# Patient Record
Sex: Female | Born: 1950 | Race: White | Hispanic: No | Smoking: Former smoker
Health system: Southern US, Community
[De-identification: ages and names within clinical notes are randomized; demographics above are authoritative.]

---

## 1999-09-11 ENCOUNTER — Other Ambulatory Visit: Admission: RE | Admit: 1999-09-11 | Discharge: 1999-09-11 | Payer: Self-pay | Admitting: Obstetrics & Gynecology

## 1999-09-16 ENCOUNTER — Ambulatory Visit (HOSPITAL_COMMUNITY): Admission: RE | Admit: 1999-09-16 | Discharge: 1999-09-16 | Payer: Self-pay | Admitting: Obstetrics & Gynecology

## 1999-09-16 ENCOUNTER — Encounter: Payer: Self-pay | Admitting: Obstetrics & Gynecology

## 1999-09-22 ENCOUNTER — Encounter: Payer: Self-pay | Admitting: Obstetrics & Gynecology

## 1999-09-22 ENCOUNTER — Encounter: Admission: RE | Admit: 1999-09-22 | Discharge: 1999-09-22 | Payer: Self-pay | Admitting: Obstetrics & Gynecology

## 2007-04-02 ENCOUNTER — Inpatient Hospital Stay (HOSPITAL_COMMUNITY): Admission: AD | Admit: 2007-04-02 | Discharge: 2007-04-02 | Payer: Self-pay | Admitting: Family Medicine

## 2007-05-20 ENCOUNTER — Encounter: Admission: RE | Admit: 2007-05-20 | Discharge: 2007-05-20 | Payer: Self-pay | Admitting: Gynecology

## 2008-05-22 ENCOUNTER — Encounter: Admission: RE | Admit: 2008-05-22 | Discharge: 2008-05-22 | Payer: Self-pay | Admitting: Gynecology

## 2010-03-19 ENCOUNTER — Other Ambulatory Visit: Payer: Self-pay | Admitting: Gynecology

## 2010-03-19 DIAGNOSIS — Z1231 Encounter for screening mammogram for malignant neoplasm of breast: Secondary | ICD-10-CM

## 2010-04-08 ENCOUNTER — Ambulatory Visit
Admission: RE | Admit: 2010-04-08 | Discharge: 2010-04-08 | Disposition: A | Discharge status: Home / Self Care | Payer: BC Managed Care – PPO | Source: Ambulatory Visit | Attending: Gynecology | Admitting: Gynecology

## 2010-04-08 DIAGNOSIS — Z1231 Encounter for screening mammogram for malignant neoplasm of breast: Secondary | ICD-10-CM

## 2010-04-11 ENCOUNTER — Other Ambulatory Visit: Payer: Self-pay | Admitting: Gynecology

## 2010-04-11 DIAGNOSIS — R928 Other abnormal and inconclusive findings on diagnostic imaging of breast: Secondary | ICD-10-CM

## 2010-04-23 ENCOUNTER — Ambulatory Visit
Admission: RE | Admit: 2010-04-23 | Discharge: 2010-04-23 | Disposition: A | Discharge status: Home / Self Care | Payer: BC Managed Care – PPO | Source: Ambulatory Visit | Attending: Gynecology | Admitting: Gynecology

## 2010-04-23 DIAGNOSIS — R928 Other abnormal and inconclusive findings on diagnostic imaging of breast: Secondary | ICD-10-CM

## 2013-11-01 ENCOUNTER — Other Ambulatory Visit: Payer: Self-pay

## 2013-11-01 DIAGNOSIS — Z1231 Encounter for screening mammogram for malignant neoplasm of breast: Secondary | ICD-10-CM

## 2013-11-24 ENCOUNTER — Other Ambulatory Visit: Payer: Self-pay | Admitting: Family Medicine

## 2013-11-24 ENCOUNTER — Ambulatory Visit
Admission: RE | Admit: 2013-11-24 | Discharge: 2013-11-24 | Disposition: A | Discharge status: Home / Self Care | Payer: BC Managed Care – PPO | Source: Ambulatory Visit

## 2013-11-24 ENCOUNTER — Other Ambulatory Visit (HOSPITAL_COMMUNITY)
Admission: RE | Admit: 2013-11-24 | Discharge: 2013-11-24 | Disposition: A | Discharge status: Home / Self Care | Payer: BC Managed Care – PPO | Source: Ambulatory Visit | Attending: Family Medicine | Admitting: Family Medicine

## 2013-11-24 DIAGNOSIS — Z1231 Encounter for screening mammogram for malignant neoplasm of breast: Secondary | ICD-10-CM

## 2013-11-24 DIAGNOSIS — Z0000 Encounter for general adult medical examination without abnormal findings: Secondary | ICD-10-CM | POA: Diagnosis present

## 2013-11-28 LAB — CYTOLOGY - PAP

## 2015-07-29 ENCOUNTER — Other Ambulatory Visit: Payer: Self-pay | Admitting: Family Medicine

## 2015-07-29 DIAGNOSIS — Z1231 Encounter for screening mammogram for malignant neoplasm of breast: Secondary | ICD-10-CM

## 2015-08-21 ENCOUNTER — Ambulatory Visit
Admission: RE | Admit: 2015-08-21 | Discharge: 2015-08-21 | Disposition: A | Discharge status: Home / Self Care | Payer: Medicare Other | Source: Ambulatory Visit | Attending: Family Medicine | Admitting: Family Medicine

## 2015-08-21 DIAGNOSIS — Z1231 Encounter for screening mammogram for malignant neoplasm of breast: Secondary | ICD-10-CM

## 2016-08-10 ENCOUNTER — Other Ambulatory Visit: Payer: Self-pay | Admitting: Physician Assistant

## 2016-08-10 DIAGNOSIS — Z1231 Encounter for screening mammogram for malignant neoplasm of breast: Secondary | ICD-10-CM

## 2016-08-27 ENCOUNTER — Ambulatory Visit
Admission: RE | Admit: 2016-08-27 | Discharge: 2016-08-27 | Disposition: A | Discharge status: Home / Self Care | Payer: Medicare Other | Source: Ambulatory Visit | Attending: Physician Assistant | Admitting: Physician Assistant

## 2016-08-27 ENCOUNTER — Encounter (INDEPENDENT_AMBULATORY_CARE_PROVIDER_SITE_OTHER): Payer: Self-pay

## 2016-08-27 DIAGNOSIS — Z1231 Encounter for screening mammogram for malignant neoplasm of breast: Secondary | ICD-10-CM

## 2017-02-25 IMAGING — MG 2D DIGITAL SCREENING BILATERAL MAMMOGRAM WITH CAD AND ADJUNCT TO
8 of 12 series · 8 of 28 positions shown · non-contrast
Comparison: Previous exam(s).

CLINICAL DATA: Screening.

EXAM:
2D DIGITAL SCREENING BILATERAL MAMMOGRAM WITH CAD AND ADJUNCT TOMO

[R MLO synth-2D]
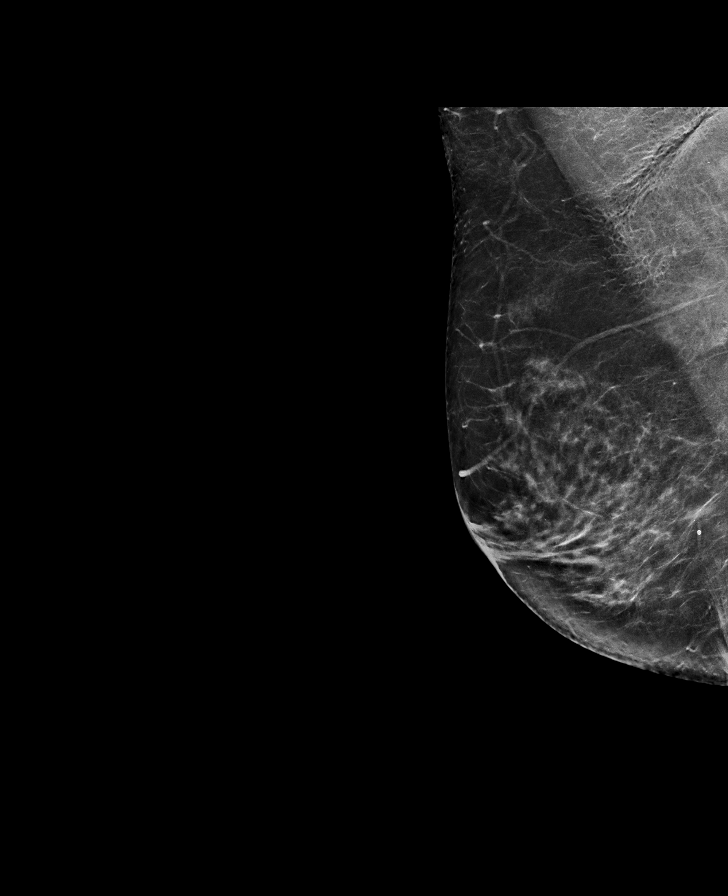

[L MLO synth-2D]
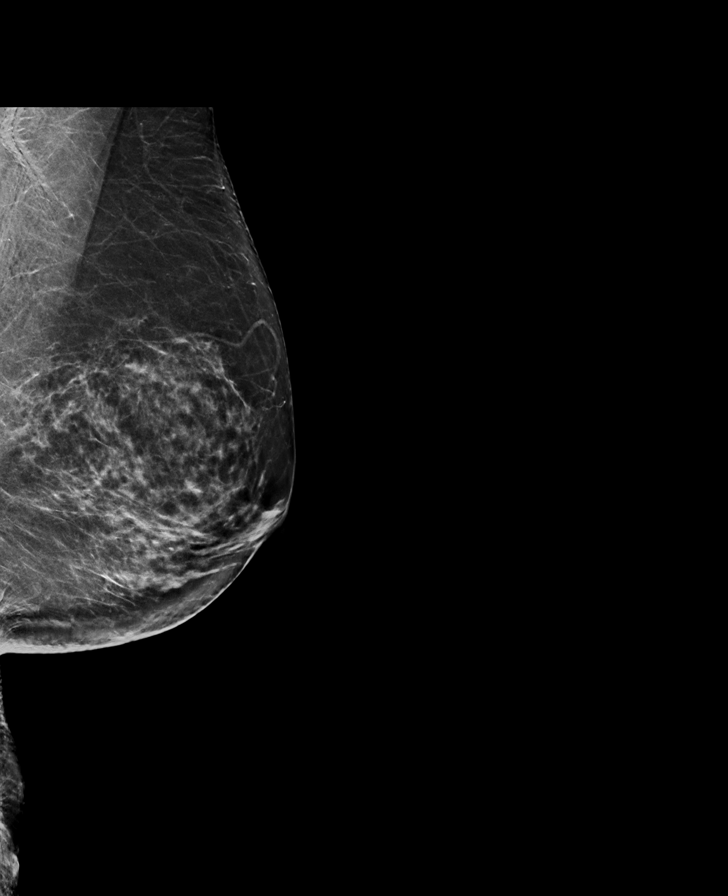

[L CC synth-2D]
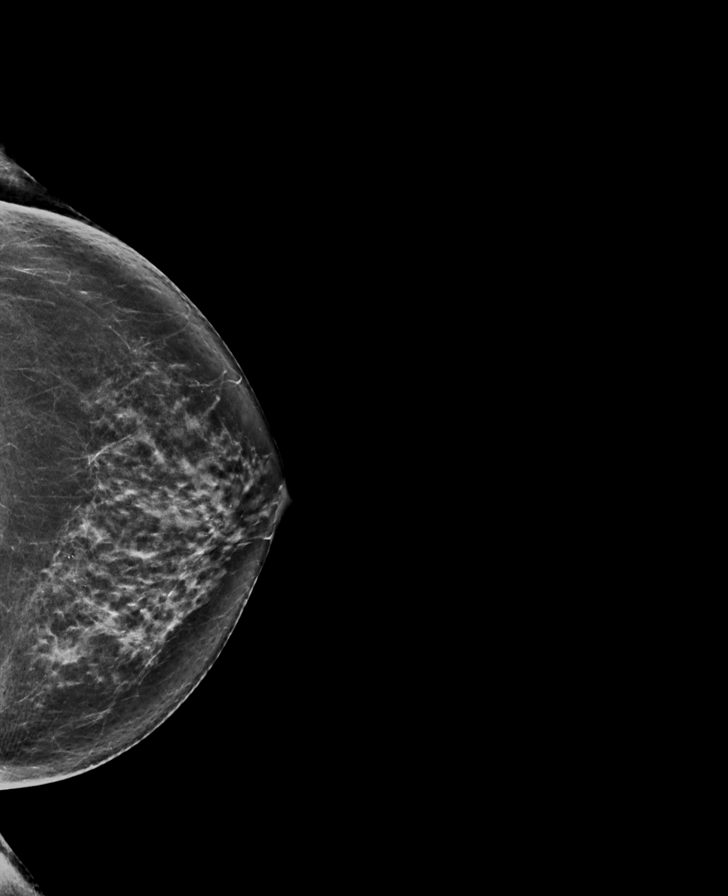

[L MLO]
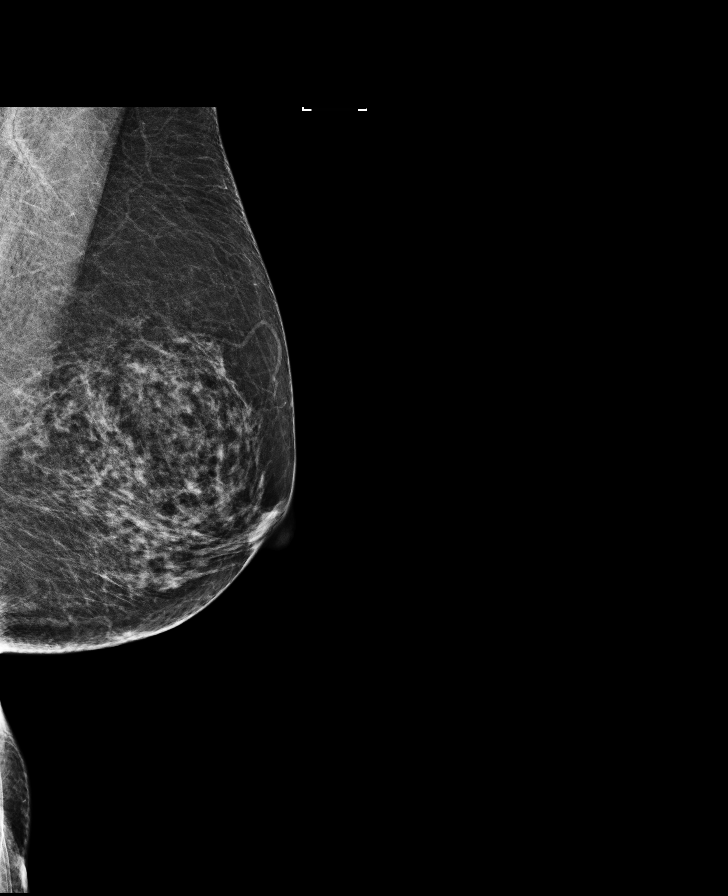

[R MLO]
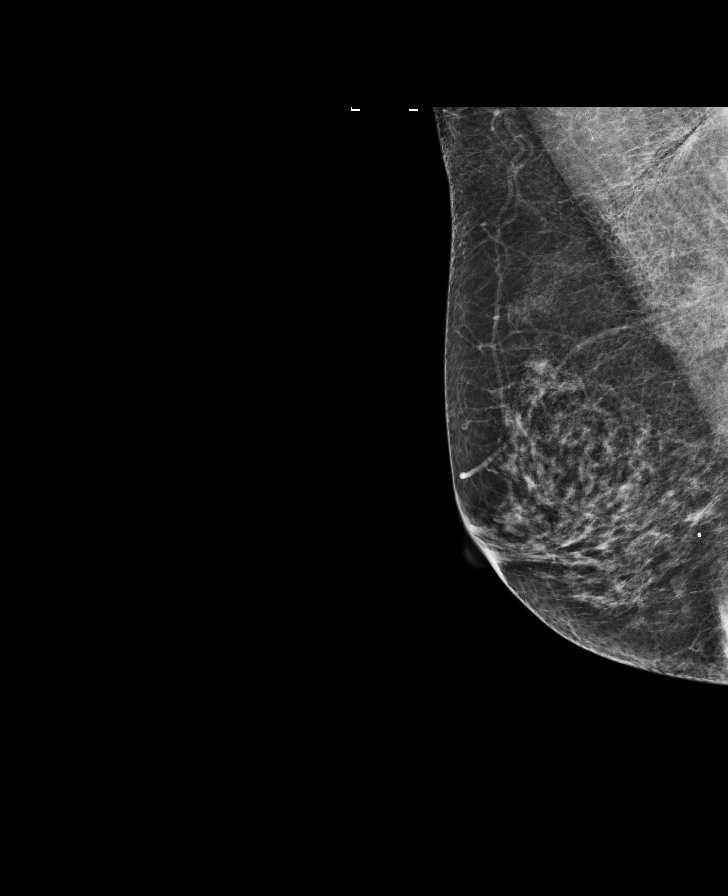

[L CC]
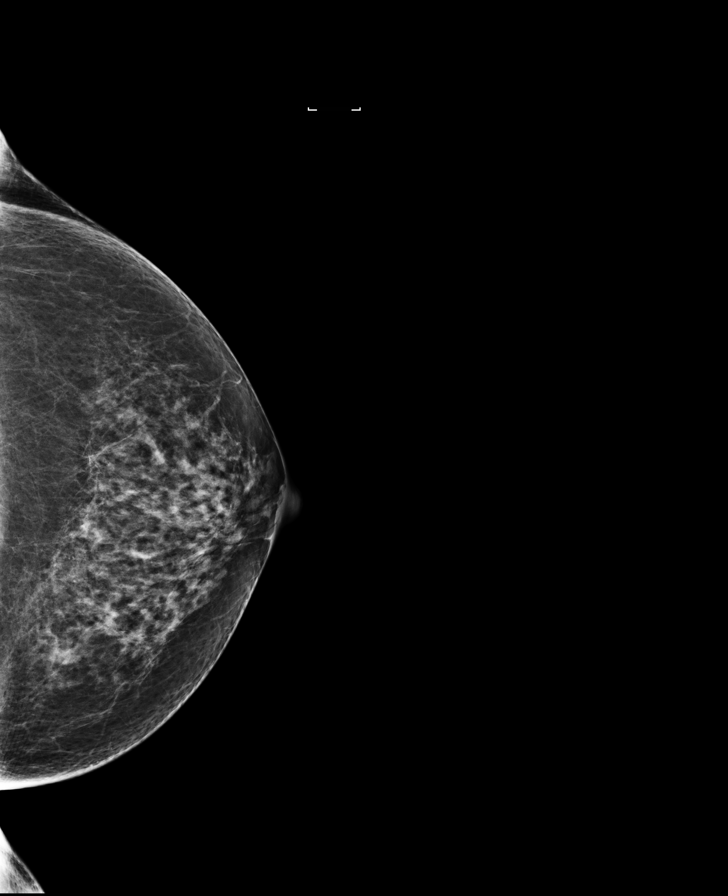

[R CC synth-2D]
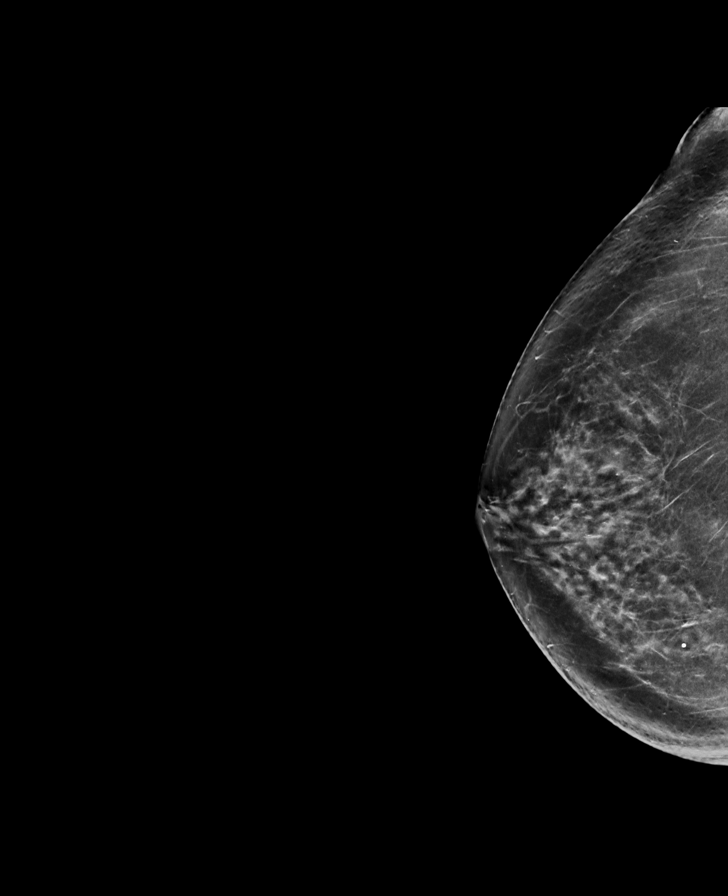

[R CC]
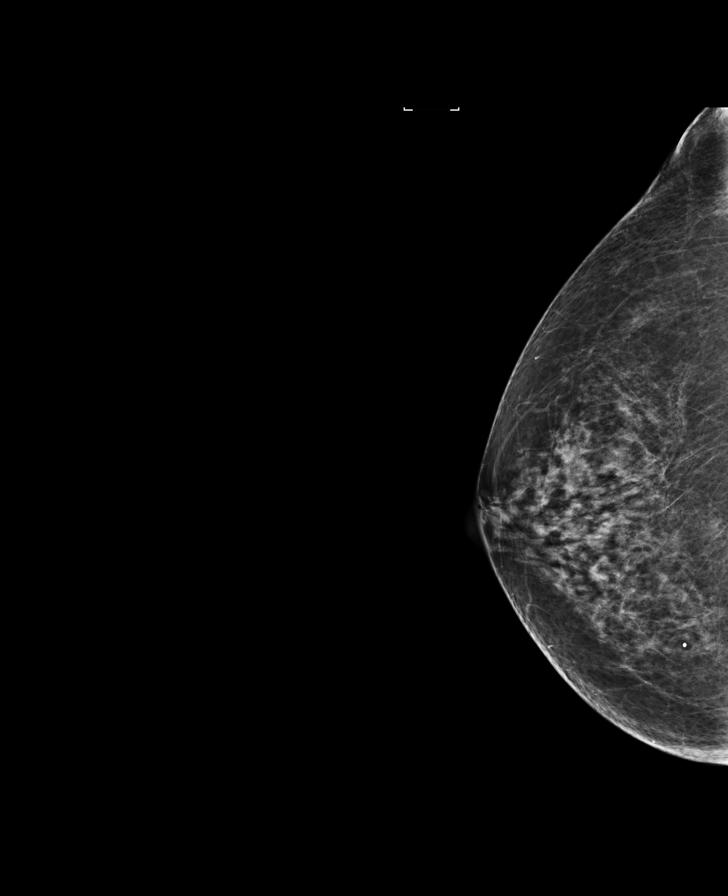

[8 of 28 positions shown; findings below may reference images not displayed]

ACR Breast Density Category c: The breast tissue is heterogeneously
dense, which may obscure small masses.
FINDINGS: There are no findings suspicious for malignancy. Images were
processed with CAD.
IMPRESSION: No mammographic evidence of malignancy. A result letter of this
screening mammogram will be mailed directly to the patient.

RECOMMENDATION:
Screening mammogram in one year. (Code:[id number])

BI-RADS CATEGORY  1: Negative.

## 2019-03-09 ENCOUNTER — Ambulatory Visit: Payer: Medicare Other

## 2019-10-25 ENCOUNTER — Other Ambulatory Visit: Payer: Medicare Other

## 2019-10-25 DIAGNOSIS — Z20822 Contact with and (suspected) exposure to covid-19: Secondary | ICD-10-CM

## 2019-10-26 LAB — NOVEL CORONAVIRUS, NAA: SARS-CoV-2, NAA: NOT DETECTED

## 2019-10-26 LAB — SARS-COV-2, NAA 2 DAY TAT

## 2019-11-08 ENCOUNTER — Other Ambulatory Visit: Payer: Medicare Other

## 2019-11-08 DIAGNOSIS — Z20822 Contact with and (suspected) exposure to covid-19: Secondary | ICD-10-CM

## 2019-11-09 LAB — NOVEL CORONAVIRUS, NAA: SARS-CoV-2, NAA: NOT DETECTED

## 2019-11-09 LAB — SARS-COV-2, NAA 2 DAY TAT

## 2019-11-17 ENCOUNTER — Ambulatory Visit: Payer: Medicare Other | Attending: Internal Medicine

## 2019-11-17 ENCOUNTER — Ambulatory Visit: Payer: Medicare Other

## 2019-11-17 DIAGNOSIS — Z23 Encounter for immunization: Secondary | ICD-10-CM

## 2019-11-17 NOTE — Progress Notes (Signed)
   Covid-19 Vaccination Clinic  Name:  Lisa Lowe    MRN: [id number] DOB: 1950/03/05  11/17/2019  Lisa Lowe was observed post Covid-19 immunization for 15 minutes without incident. She was provided with Vaccine Information Sheet and instruction to access the V-Safe system.   Lisa Lowe was instructed to call 911 with any severe reactions post vaccine: Marland Kitchen Difficulty breathing  . Swelling of face and throat  . A fast heartbeat  . A bad rash all over body  . Dizziness and weakness

## 2020-01-12 ENCOUNTER — Other Ambulatory Visit: Payer: Medicare Other

## 2020-01-12 DIAGNOSIS — Z20822 Contact with and (suspected) exposure to covid-19: Secondary | ICD-10-CM

## 2020-01-13 LAB — NOVEL CORONAVIRUS, NAA: SARS-CoV-2, NAA: NOT DETECTED

## 2020-01-13 LAB — SARS-COV-2, NAA 2 DAY TAT

## 2020-02-06 ENCOUNTER — Other Ambulatory Visit: Payer: Medicare Other

## 2020-02-06 DIAGNOSIS — Z20822 Contact with and (suspected) exposure to covid-19: Secondary | ICD-10-CM

## 2020-02-08 LAB — NOVEL CORONAVIRUS, NAA: SARS-CoV-2, NAA: NOT DETECTED

## 2020-02-08 LAB — SARS-COV-2, NAA 2 DAY TAT

## 2020-02-13 ENCOUNTER — Other Ambulatory Visit: Payer: Medicare Other

## 2020-05-02 ENCOUNTER — Ambulatory Visit: Payer: Medicare Other | Attending: Critical Care Medicine

## 2020-05-02 DIAGNOSIS — Z20822 Contact with and (suspected) exposure to covid-19: Secondary | ICD-10-CM

## 2020-05-03 LAB — SARS-COV-2, NAA 2 DAY TAT

## 2020-05-03 LAB — NOVEL CORONAVIRUS, NAA: SARS-CoV-2, NAA: NOT DETECTED

## 2020-05-03 LAB — SPECIMEN STATUS REPORT

## 2020-05-09 ENCOUNTER — Other Ambulatory Visit: Payer: Self-pay

## 2020-05-09 ENCOUNTER — Ambulatory Visit: Payer: Medicare Other | Attending: Internal Medicine

## 2020-05-09 DIAGNOSIS — Z23 Encounter for immunization: Secondary | ICD-10-CM

## 2020-05-09 NOTE — Progress Notes (Signed)
   Covid-19 Vaccination Clinic  Name:  Lisa Lowe    MRN: [id number] DOB: [birth year]  05/09/2020  [name] was observed post Covid-19 immunization for 15 minutes without incident. She was provided with Vaccine Information Sheet and instruction to access the V-Safe system.   [name] was instructed to call 911 with any severe reactions post vaccine: Marland Kitchen Difficulty breathing  . Swelling of face and throat  . A fast heartbeat  . A bad rash all over body  . Dizziness and weakness   Immunizations Administered    Name Date Dose VIS Date Route   PFIZER Comrnaty(Gray TOP) Covid-19 Vaccine 05/09/2020 12:35 PM 0.3 mL 01/04/2020 Intramuscular   Manufacturer: [employer], [employer]   Lot: [id number]   NDC: [phone]

## 2020-05-10 ENCOUNTER — Other Ambulatory Visit (HOSPITAL_BASED_OUTPATIENT_CLINIC_OR_DEPARTMENT_OTHER): Payer: Self-pay

## 2020-05-10 MED ORDER — COVID-19 MRNA VACCINE (PFIZER) 30 MCG/0.3ML IM SUSP
INTRAMUSCULAR | 0 refills | Status: AC
  Filled 2020-05-10: qty 0.3, 1d supply, fill #0

## 2020-06-11 ENCOUNTER — Ambulatory Visit: Payer: Medicare Other | Attending: Critical Care Medicine

## 2020-06-11 DIAGNOSIS — Z20822 Contact with and (suspected) exposure to covid-19: Secondary | ICD-10-CM

## 2020-06-12 LAB — NOVEL CORONAVIRUS, NAA: SARS-CoV-2, NAA: NOT DETECTED

## 2020-06-12 LAB — SARS-COV-2, NAA 2 DAY TAT

## 2021-06-11 ENCOUNTER — Ambulatory Visit: Payer: Medicare Other | Attending: Internal Medicine

## 2021-06-11 DIAGNOSIS — Z23 Encounter for immunization: Secondary | ICD-10-CM

## 2021-06-13 ENCOUNTER — Other Ambulatory Visit (HOSPITAL_BASED_OUTPATIENT_CLINIC_OR_DEPARTMENT_OTHER): Payer: Self-pay

## 2021-06-13 MED ORDER — PFIZER COVID-19 VAC BIVALENT 30 MCG/0.3ML IM SUSP
INTRAMUSCULAR | 0 refills | Status: AC
  Filled 2021-06-13: qty 0.3, 1d supply, fill #0

## 2021-06-13 NOTE — Progress Notes (Signed)
   Covid-19 Vaccination Clinic  Name:  Lisa Lowe    MRN: [id number] DOB: 12/23/1950  06/13/2021  Lisa Lowe was observed post Covid-19 immunization for 15 minutes without incident. She was provided with Vaccine Information Sheet and instruction to access the V-Safe system.   Lisa Lowe was instructed to call 911 with any severe reactions post vaccine: Difficulty breathing  Swelling of face and throat  A fast heartbeat  A bad rash all over body  Dizziness and weakness   Immunizations Administered     Name Date Dose VIS Date Route   Pfizer Covid-19 Vaccine Bivalent Booster 06/11/2021 10:36 AM 0.3 mL 09/25/2020 Intramuscular   Manufacturer: [place]   Lot: [id number]   [place]: [phone]

## 2021-08-08 ENCOUNTER — Ambulatory Visit: Payer: Medicare Other | Admitting: Podiatry

## 2021-08-13 ENCOUNTER — Ambulatory Visit: Payer: Medicare PPO | Admitting: Podiatry

## 2021-08-13 ENCOUNTER — Ambulatory Visit (INDEPENDENT_AMBULATORY_CARE_PROVIDER_SITE_OTHER): Payer: Medicare PPO

## 2021-08-13 DIAGNOSIS — M2041 Other hammer toe(s) (acquired), right foot: Secondary | ICD-10-CM

## 2021-08-13 DIAGNOSIS — M2011 Hallux valgus (acquired), right foot: Secondary | ICD-10-CM

## 2021-08-13 NOTE — Progress Notes (Signed)
  Subjective:  Patient ID: Lisa Lowe, female    DOB: 1950/05/11,  MRN: [id number]  Chief Complaint  Patient presents with   Foot Problem    Bunion and hammertoe right foot, pain located on midlateral side, level 5    71 y.o. female presents with the above complaint.  Patient presents with complaint of right HAV and hammertoe contracture.  Patient states been present for quite some time.  Hurts with ambulation.  Pain scale 7 out of 10.  Is been present and gradually worsening over time.  She has not been able to come and get it evaluated.  She would like to discuss conservative care for now.  Eventually the future she might consider surgical options.   Review of Systems: Negative except as noted in the HPI. Denies N/V/F/Ch.  No past medical history on file.  Current Outpatient Medications:    COVID-19 mRNA bivalent vaccine, Pfizer, (PFIZER COVID-19 VAC BIVALENT) injection, Inject into the muscle., Disp: 0.3 mL, Rfl: 0   COVID-19 mRNA vaccine, Pfizer, 30 MCG/0.3ML injection, Inject into the muscle., Disp: 0.3 mL, Rfl: 0  Social History   Tobacco Use  Smoking Status Not on file  Smokeless Tobacco Not on file    Allergies  Allergen Reactions   Penicillins Other (See Comments) and Rash    Other reaction(s): Other (See Comments) Does not remember Does not remember  Does not remember    Objective:  There were no vitals filed for this visit. There is no height or weight on file to calculate BMI. Constitutional Well developed. Well nourished.  Vascular Dorsalis pedis pulses palpable bilaterally. Posterior tibial pulses palpable bilaterally. Capillary refill normal to all digits.  No cyanosis or clubbing noted. Pedal hair growth normal.  Neurologic Normal speech. Oriented to person, place, and time. Epicritic sensation to light touch grossly present bilaterally.  Dermatologic Nails well groomed and normal in appearance. No open wounds. No skin lesions.   Orthopedic: Pain on palpation to the right medial eminence with moderate bunion deformity noted.  Good range of motion noted of the first metatarsophalangeal joint limited however adequate no intra-articular pain noted to the first MPJ joint.  Hammertoe contracture noted of the second digit PIPJ joint with joint contracture at the MPJ joint.  Predislocation syndrome noted.   Radiographs: 3 views of skeletally mature the right foot L foot: Moderate bunion deformity noted with mild concern for osteoarthritic changes to the first MPJ joint.  No osteophytes noted.  No severe osteoarthritis noted.  Hammertoe contracture noted of the second digit. Assessment:   1. Hammertoe of right foot   2. Hav (hallux abducto valgus), right    Plan:  Patient was evaluated and treated and all questions answered.  Right moderate bunion deformity/HAV with second digit hammertoe contracture/predislocation syndrome -All questions and concerns were discussed with the patient in extensive detail -I discussed with her conservative care including shoe gear modification orthotics padding taping in extensive detail she states understanding of briefly discussed surgical options with her as well.  She would like to think about the procedure and will get back to me when she is ready. -She will benefit.  In the near future with a chevron osteotomy with possible Akin osteotomy with hammertoe contracture/arthroplasty with capsulotomy of the MPJ joint  No follow-ups on file.

## 2021-10-09 ENCOUNTER — Other Ambulatory Visit (HOSPITAL_BASED_OUTPATIENT_CLINIC_OR_DEPARTMENT_OTHER): Payer: Self-pay

## 2021-10-09 MED ORDER — INFLUENZA VAC A&B SA ADJ QUAD 0.5 ML IM PRSY
PREFILLED_SYRINGE | INTRAMUSCULAR | 0 refills | Status: AC
  Filled 2021-10-09 (×2): qty 0.5, 1d supply, fill #0

## 2021-10-10 ENCOUNTER — Other Ambulatory Visit (HOSPITAL_BASED_OUTPATIENT_CLINIC_OR_DEPARTMENT_OTHER): Payer: Self-pay

## 2022-02-21 ENCOUNTER — Other Ambulatory Visit (HOSPITAL_BASED_OUTPATIENT_CLINIC_OR_DEPARTMENT_OTHER): Payer: Self-pay

## 2022-02-26 ENCOUNTER — Other Ambulatory Visit (HOSPITAL_BASED_OUTPATIENT_CLINIC_OR_DEPARTMENT_OTHER): Payer: Self-pay

## 2022-02-26 MED ORDER — AREXVY 120 MCG/0.5ML IM SUSR
INTRAMUSCULAR | 0 refills | Status: AC
  Filled 2022-02-26: qty 0.5, 1d supply, fill #0

## 2022-02-27 ENCOUNTER — Other Ambulatory Visit (HOSPITAL_BASED_OUTPATIENT_CLINIC_OR_DEPARTMENT_OTHER): Payer: Self-pay

## 2022-04-08 ENCOUNTER — Other Ambulatory Visit (HOSPITAL_BASED_OUTPATIENT_CLINIC_OR_DEPARTMENT_OTHER): Payer: Self-pay

## 2022-04-08 MED ORDER — COMIRNATY 30 MCG/0.3ML IM SUSY
PREFILLED_SYRINGE | INTRAMUSCULAR | 0 refills | Status: AC
  Filled 2022-04-08: qty 0.3, 1d supply, fill #0

## 2022-05-14 ENCOUNTER — Other Ambulatory Visit (HOSPITAL_BASED_OUTPATIENT_CLINIC_OR_DEPARTMENT_OTHER): Payer: Self-pay

## 2022-08-23 ENCOUNTER — Ambulatory Visit (HOSPITAL_COMMUNITY)
Admission: RE | Admit: 2022-08-23 | Discharge: 2022-08-23 | Disposition: A | Discharge status: Home / Self Care | Payer: Medicare PPO | Source: Ambulatory Visit | Attending: Emergency Medicine | Admitting: Emergency Medicine

## 2022-08-23 ENCOUNTER — Encounter (HOSPITAL_COMMUNITY): Payer: Self-pay

## 2022-08-23 VITALS — BP 134/87 | HR 78 | Temp 98.3°F | Resp 16

## 2022-08-23 DIAGNOSIS — N3001 Acute cystitis with hematuria: Secondary | ICD-10-CM | POA: Diagnosis present

## 2022-08-23 DIAGNOSIS — M545 Low back pain, unspecified: Secondary | ICD-10-CM

## 2022-08-23 LAB — POCT URINALYSIS DIP (MANUAL ENTRY)
Bilirubin, UA: NEGATIVE
Glucose, UA: NEGATIVE mg/dL
Ketones, POC UA: NEGATIVE mg/dL
Nitrite, UA: NEGATIVE
Protein Ur, POC: NEGATIVE mg/dL
Spec Grav, UA: 1.01 (ref 1.010–1.025)
Urobilinogen, UA: 0.2 E.U./dL
pH, UA: 5 (ref 5.0–8.0)

## 2022-08-23 MED ORDER — BACLOFEN 10 MG PO TABS: 10.0000 mg | ORAL_TABLET | Freq: Three times a day (TID) | ORAL | 0 refills | Status: AC

## 2022-08-23 MED ORDER — CEPHALEXIN 500 MG PO CAPS: 500.0000 mg | ORAL_CAPSULE | Freq: Two times a day (BID) | ORAL | 0 refills | Status: AC

## 2022-08-23 NOTE — Discharge Instructions (Addendum)
There was a little bit of blood and white blood cells in your urine I am treating you for a urinary tract infection.  We will call you if anything on urine culture requires a change in therapy (about 1-3 days)  Please take the antibiotic as prescribed, with food to avoid upset stomach. Drink lots of fluids!  Please watch for worsening symptoms including abdominal pain, upper back pain, fever, weakness, confusion.  If these occur please return immediately or go to the emergency department.   For the low back pain, continue hot pad and tylenol. You can also add the baclofen which is a muscle relaxer. You can take it 3x daily. If the medication makes you drowsy, take only at bed time.

## 2022-08-23 NOTE — ED Provider Notes (Signed)
[facility]    CSN: [id number] Arrival date & time: 08/23/22  1407     History   Chief Complaint Chief Complaint  Patient presents with   Hematuria   Back Pain   Dysuria    HPI Lisa Lowe is a 72 y.o. female.  Here with some dysuria and hematuria noticed yesterday Mild symptoms but wanted to be evaluated Not having urgency/frequency, fever or flank pain  Also about a week of low back pain on the right side Happened after moving boxes and lifting Using hot pad and tylenol with some relief but still bothering her No radiation into legs. No bladder/bowel dysfunction   History reviewed. No pertinent past medical history.  There are no problems to display for this patient.   History reviewed. No pertinent surgical history.  OB History   No obstetric history on file.      Home Medications    Prior to Admission medications   Medication Sig Start Date End Date Taking? Authorizing Provider  baclofen (LIORESAL) 10 MG tablet Take 1 tablet (10 mg total) by mouth 3 (three) times daily. 08/23/22  Yes Citlali Gautney, Lurena Joiner, PA-C  cephALEXin (KEFLEX) 500 MG capsule Take 1 capsule (500 mg total) by mouth 2 (two) times daily for 7 days. 08/23/22 08/30/22 Yes Briannie Gutierrez, PA-C  COVID-19 mRNA bivalent vaccine, Pfizer, (PFIZER COVID-19 VAC BIVALENT) injection Inject into the muscle. 06/13/21   Judyann Munson, MD  COVID-19 mRNA vaccine [phone] (COMIRNATY) syringe Inject into the muscle. 04/08/22     COVID-19 mRNA vaccine, Pfizer, 30 MCG/0.3ML injection Inject into the muscle. 05/03/20   Judyann Munson, MD  influenza vaccine adjuvanted (FLUAD) 0.5 ML injection Inject into the muscle. 10/09/21     RSV vaccine recomb adjuvanted (AREXVY) 120 MCG/0.5ML injection Inject into the muscle. 02/26/22       Family History History reviewed. No pertinent family history.  Social History Social History   Tobacco Use   Smoking status: Former    Types: Cigarettes   Smokeless  tobacco: Never  Vaping Use   Vaping status: Never Used  Substance Use Topics   Alcohol use: Yes   Drug use: Never     Allergies   Penicillins   Review of Systems Review of Systems As per HPI  Physical Exam Triage Vital Signs ED Triage Vitals  Encounter Vitals Group     BP 08/23/22 1505 134/87     Systolic BP Percentile --      Diastolic BP Percentile --      Pulse Rate 08/23/22 1505 78     Resp 08/23/22 1505 16     Temp 08/23/22 1505 98.3 F (36.8 C)     Temp Source 08/23/22 1505 Oral     SpO2 08/23/22 1505 96 %     Weight --      Height --      Head Circumference --      Peak Flow --      Pain Score 08/23/22 1506 2     Pain Loc --      Pain Education --      Exclude from Growth Chart --    No data found.  Updated Vital Signs BP 134/87 (BP Location: Right Arm)   Pulse 78   Temp 98.3 F (36.8 C) (Oral)   Resp 16   SpO2 96%    Physical Exam Vitals and nursing note reviewed.  Constitutional:      General: She is not in acute distress.  HENT:     Mouth/Throat:     Mouth: Mucous membranes are moist.     Pharynx: Oropharynx is clear.  Eyes:     Conjunctiva/sclera: Conjunctivae normal.     Pupils: Pupils are equal, round, and reactive to light.  Cardiovascular:     Rate and Rhythm: Normal rate and regular rhythm.     Heart sounds: Normal heart sounds.  Pulmonary:     Effort: Pulmonary effort is normal.     Breath sounds: Normal breath sounds.  Abdominal:     General: Bowel sounds are normal.     Palpations: Abdomen is soft.     Tenderness: There is no abdominal tenderness. There is no right CVA tenderness, left CVA tenderness, guarding or rebound.  Musculoskeletal:     Comments: No bony/spinal tenderness C-L spine. There is minimal tenderness right lower back paraspinals   Neurological:     Mental Status: She is alert and oriented to person, place, and time.     UC Treatments / Results  Labs (all labs ordered are listed, but only abnormal  results are displayed) Labs Reviewed  POCT URINALYSIS DIP (MANUAL ENTRY) - Abnormal; Notable for the following components:      Result Value   Blood, UA trace-intact (*)    Leukocytes, UA Small (1+) (*)    All other components within normal limits  URINE CULTURE    EKG   Radiology No results found.  Procedures Procedures (including critical care time)  Medications Ordered in UC Medications - No data to display  Initial Impression / Assessment and Plan / UC Course  I have reviewed the triage vital signs and the nursing notes.  Pertinent labs & imaging results that were available during my care of the patient were reviewed by me and considered in my medical decision making (see chart for details).  Small leuks and trace RBC in urine. Culture is pending Will start abx treatment with keflex BID x 7 days Her PCN allergy is rash and happened when she was 72 years old. Has taken keflex without issues. Discussed monitoring for worsening symptoms, return and ED precautions.   Low back pain is muscular. No indication for imaging today Will continue tylenol and hot pad Add baclofen TID prn, advised drowsy precautions Recommend to follow with PCP if persisting   Final Clinical Impressions(s) / UC Diagnoses   Final diagnoses:  Acute cystitis with hematuria  Acute right-sided low back pain without sciatica     Discharge Instructions      There was a little bit of blood and white blood cells in your urine I am treating you for a urinary tract infection.  We will call you if anything on urine culture requires a change in therapy (about 1-3 days)  Please take the antibiotic as prescribed, with food to avoid upset stomach. Drink lots of fluids!  Please watch for worsening symptoms including abdominal pain, upper back pain, fever, weakness, confusion.  If these occur please return immediately or go to the emergency department.   For the low back pain, continue hot pad and  tylenol. You can also add the baclofen which is a muscle relaxer. You can take it 3x daily. If the medication makes you drowsy, take only at bed time.     ED Prescriptions     Medication Sig Dispense Auth. Provider   baclofen (LIORESAL) 10 MG tablet Take 1 tablet (10 mg total) by mouth 3 (three) times daily. 30 each [name] Lanter, Lurena Joiner, PA-C  cephALEXin (KEFLEX) 500 MG capsule Take 1 capsule (500 mg total) by mouth 2 (two) times daily for 7 days. 14 capsule Brennin Durfee, Lurena Joiner, PA-C      PDMP not reviewed this encounter.   Jaiveer Panas, Lurena Joiner, [place] 08/23/22 1702

## 2022-08-23 NOTE — ED Triage Notes (Signed)
Patient reports that she hurt her right lower back a week ago and has been taking  Tylenol ES with some relief. Patient reports that she noticed some blood in her urine x 2 since yesterday and "slight burning" when she urinates today.

## 2023-11-05 ENCOUNTER — Other Ambulatory Visit (HOSPITAL_BASED_OUTPATIENT_CLINIC_OR_DEPARTMENT_OTHER): Payer: Self-pay

## 2023-11-05 MED ORDER — COMIRNATY 30 MCG/0.3ML IM SUSY
0.3000 mL | PREFILLED_SYRINGE | Freq: Once | INTRAMUSCULAR | 0 refills | Status: AC
  Filled 2023-11-05: qty 0.3, 1d supply, fill #0

## 2023-11-07 ENCOUNTER — Other Ambulatory Visit (HOSPITAL_BASED_OUTPATIENT_CLINIC_OR_DEPARTMENT_OTHER): Payer: Self-pay
